# Patient Record
Sex: Female | Born: 2000 | Race: White | Hispanic: No | Marital: Single | Smoking: Never smoker
Health system: Southern US, Community
[De-identification: ages and names within clinical notes are randomized; demographics above are authoritative.]

## PROBLEM LIST (undated history)

## (undated) DIAGNOSIS — F32A Depression, unspecified: Secondary | ICD-10-CM

## (undated) DIAGNOSIS — F419 Anxiety disorder, unspecified: Secondary | ICD-10-CM

## (undated) DIAGNOSIS — G43909 Migraine, unspecified, not intractable, without status migrainosus: Secondary | ICD-10-CM

## (undated) DIAGNOSIS — M1380 Other specified arthritis, unspecified site: Secondary | ICD-10-CM

## (undated) HISTORY — DX: Depression, unspecified: F32.A

## (undated) HISTORY — DX: Other specified arthritis, unspecified site: M13.80

## (undated) HISTORY — DX: Migraine, unspecified, not intractable, without status migrainosus: G43.909

## (undated) HISTORY — DX: Anxiety disorder, unspecified: F41.9

## (undated) HISTORY — PX: HERNIA REPAIR: SHX51

---
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
## 2019-01-10 DIAGNOSIS — N390 Urinary tract infection, site not specified: Secondary | ICD-10-CM | POA: Diagnosis not present

## 2019-01-10 DIAGNOSIS — L259 Unspecified contact dermatitis, unspecified cause: Secondary | ICD-10-CM | POA: Diagnosis not present

## 2019-01-11 DIAGNOSIS — K529 Noninfective gastroenteritis and colitis, unspecified: Secondary | ICD-10-CM | POA: Diagnosis not present

## 2019-01-11 DIAGNOSIS — R1084 Generalized abdominal pain: Secondary | ICD-10-CM | POA: Diagnosis not present

## 2019-01-12 ENCOUNTER — Other Ambulatory Visit: Payer: Self-pay | Admitting: Gastroenterology

## 2019-01-12 ENCOUNTER — Ambulatory Visit
Admission: RE | Admit: 2019-01-12 | Discharge: 2019-01-12 | Disposition: A | Discharge status: Home / Self Care | Payer: BLUE CROSS/BLUE SHIELD | Source: Ambulatory Visit | Attending: Gastroenterology | Admitting: Gastroenterology

## 2019-01-12 DIAGNOSIS — K529 Noninfective gastroenteritis and colitis, unspecified: Secondary | ICD-10-CM | POA: Diagnosis not present

## 2019-01-12 DIAGNOSIS — R1084 Generalized abdominal pain: Secondary | ICD-10-CM | POA: Diagnosis not present

## 2019-01-12 DIAGNOSIS — R109 Unspecified abdominal pain: Secondary | ICD-10-CM | POA: Diagnosis not present

## 2019-02-08 DIAGNOSIS — R110 Nausea: Secondary | ICD-10-CM | POA: Diagnosis not present

## 2019-02-08 DIAGNOSIS — K580 Irritable bowel syndrome with diarrhea: Secondary | ICD-10-CM | POA: Diagnosis not present

## 2019-02-14 DIAGNOSIS — F411 Generalized anxiety disorder: Secondary | ICD-10-CM | POA: Diagnosis not present

## 2019-03-06 DIAGNOSIS — F411 Generalized anxiety disorder: Secondary | ICD-10-CM | POA: Diagnosis not present

## 2019-03-07 DIAGNOSIS — N941 Unspecified dyspareunia: Secondary | ICD-10-CM | POA: Diagnosis not present

## 2019-03-29 DIAGNOSIS — F411 Generalized anxiety disorder: Secondary | ICD-10-CM | POA: Diagnosis not present

## 2019-08-19 IMAGING — CR ABDOMEN - 1 VIEW
1 series · 1 of 1 positions shown · non-contrast
Comparison: None.

CLINICAL DATA: Generalized abdominal pain

EXAM:
ABDOMEN - 1 VIEW

[t abdomen supine]
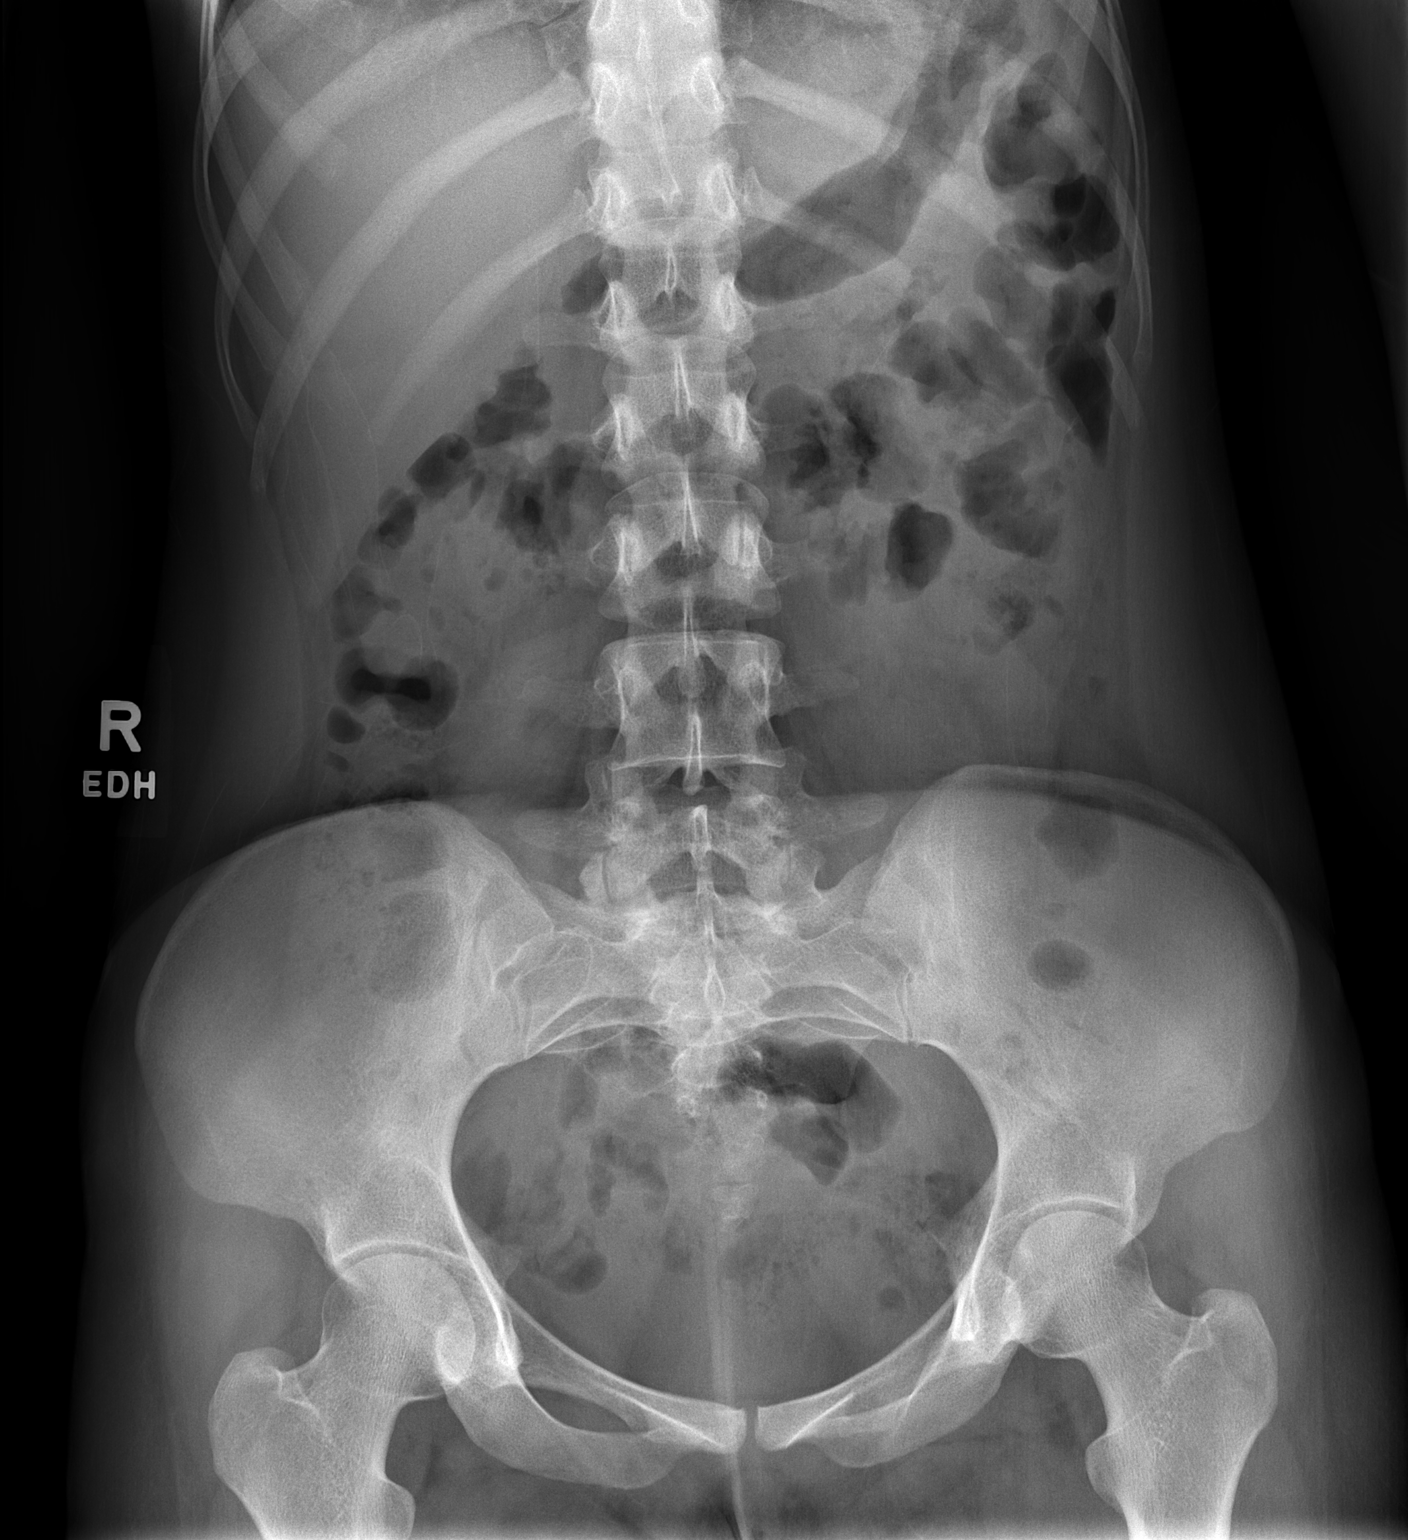

[1 of 1 positions shown; findings below may reference images not displayed]

FINDINGS: The bowel gas pattern is normal. No radio-opaque calculi or other
significant radiographic abnormality are seen.
IMPRESSION: Negative.

## 2020-06-28 DIAGNOSIS — Z20828 Contact with and (suspected) exposure to other viral communicable diseases: Secondary | ICD-10-CM | POA: Diagnosis not present

## 2020-08-23 DIAGNOSIS — F411 Generalized anxiety disorder: Secondary | ICD-10-CM | POA: Diagnosis not present

## 2021-01-17 DIAGNOSIS — Z20822 Contact with and (suspected) exposure to covid-19: Secondary | ICD-10-CM | POA: Diagnosis not present

## 2021-03-15 DIAGNOSIS — H66011 Acute suppurative otitis media with spontaneous rupture of ear drum, right ear: Secondary | ICD-10-CM | POA: Diagnosis not present

## 2021-03-28 DIAGNOSIS — F418 Other specified anxiety disorders: Secondary | ICD-10-CM | POA: Diagnosis not present

## 2021-03-28 DIAGNOSIS — Z6821 Body mass index (BMI) 21.0-21.9, adult: Secondary | ICD-10-CM | POA: Diagnosis not present

## 2021-03-28 DIAGNOSIS — Z01419 Encounter for gynecological examination (general) (routine) without abnormal findings: Secondary | ICD-10-CM | POA: Diagnosis not present

## 2021-03-28 DIAGNOSIS — Z113 Encounter for screening for infections with a predominantly sexual mode of transmission: Secondary | ICD-10-CM | POA: Diagnosis not present

## 2021-03-28 DIAGNOSIS — Z3040 Encounter for surveillance of contraceptives, unspecified: Secondary | ICD-10-CM | POA: Diagnosis not present

## 2021-05-09 DIAGNOSIS — R109 Unspecified abdominal pain: Secondary | ICD-10-CM | POA: Diagnosis not present

## 2021-05-09 DIAGNOSIS — K580 Irritable bowel syndrome with diarrhea: Secondary | ICD-10-CM | POA: Diagnosis not present

## 2021-05-09 DIAGNOSIS — R197 Diarrhea, unspecified: Secondary | ICD-10-CM | POA: Diagnosis not present

## 2021-05-14 DIAGNOSIS — R197 Diarrhea, unspecified: Secondary | ICD-10-CM | POA: Diagnosis not present

## 2021-06-20 DIAGNOSIS — F411 Generalized anxiety disorder: Secondary | ICD-10-CM | POA: Diagnosis not present

## 2021-06-20 DIAGNOSIS — K589 Irritable bowel syndrome without diarrhea: Secondary | ICD-10-CM | POA: Diagnosis not present

## 2021-06-20 DIAGNOSIS — R197 Diarrhea, unspecified: Secondary | ICD-10-CM | POA: Diagnosis not present

## 2021-07-16 DIAGNOSIS — F411 Generalized anxiety disorder: Secondary | ICD-10-CM | POA: Diagnosis not present

## 2021-07-28 DIAGNOSIS — F411 Generalized anxiety disorder: Secondary | ICD-10-CM | POA: Diagnosis not present

## 2021-08-13 DIAGNOSIS — F411 Generalized anxiety disorder: Secondary | ICD-10-CM | POA: Diagnosis not present

## 2021-08-27 DIAGNOSIS — F411 Generalized anxiety disorder: Secondary | ICD-10-CM | POA: Diagnosis not present

## 2021-09-16 DIAGNOSIS — F411 Generalized anxiety disorder: Secondary | ICD-10-CM | POA: Diagnosis not present

## 2021-09-30 DIAGNOSIS — F411 Generalized anxiety disorder: Secondary | ICD-10-CM | POA: Diagnosis not present

## 2021-10-14 DIAGNOSIS — F411 Generalized anxiety disorder: Secondary | ICD-10-CM | POA: Diagnosis not present

## 2021-10-17 DIAGNOSIS — J039 Acute tonsillitis, unspecified: Secondary | ICD-10-CM | POA: Diagnosis not present

## 2021-10-24 DIAGNOSIS — F411 Generalized anxiety disorder: Secondary | ICD-10-CM | POA: Diagnosis not present

## 2021-10-26 DIAGNOSIS — J029 Acute pharyngitis, unspecified: Secondary | ICD-10-CM | POA: Diagnosis not present

## 2021-10-26 DIAGNOSIS — B085 Enteroviral vesicular pharyngitis: Secondary | ICD-10-CM | POA: Diagnosis not present

## 2021-11-11 DIAGNOSIS — F411 Generalized anxiety disorder: Secondary | ICD-10-CM | POA: Diagnosis not present

## 2021-11-25 DIAGNOSIS — F411 Generalized anxiety disorder: Secondary | ICD-10-CM | POA: Diagnosis not present

## 2021-11-26 DIAGNOSIS — J351 Hypertrophy of tonsils: Secondary | ICD-10-CM | POA: Diagnosis not present

## 2021-11-26 DIAGNOSIS — J302 Other seasonal allergic rhinitis: Secondary | ICD-10-CM | POA: Diagnosis not present

## 2021-11-26 DIAGNOSIS — R070 Pain in throat: Secondary | ICD-10-CM | POA: Diagnosis not present

## 2021-12-16 DIAGNOSIS — F411 Generalized anxiety disorder: Secondary | ICD-10-CM | POA: Diagnosis not present

## 2021-12-30 DIAGNOSIS — F411 Generalized anxiety disorder: Secondary | ICD-10-CM | POA: Diagnosis not present

## 2022-01-20 DIAGNOSIS — F411 Generalized anxiety disorder: Secondary | ICD-10-CM | POA: Diagnosis not present

## 2022-02-18 DIAGNOSIS — F411 Generalized anxiety disorder: Secondary | ICD-10-CM | POA: Diagnosis not present

## 2022-03-03 DIAGNOSIS — R52 Pain, unspecified: Secondary | ICD-10-CM | POA: Diagnosis not present

## 2022-03-09 DIAGNOSIS — N760 Acute vaginitis: Secondary | ICD-10-CM | POA: Diagnosis not present

## 2022-03-11 DIAGNOSIS — F411 Generalized anxiety disorder: Secondary | ICD-10-CM | POA: Diagnosis not present

## 2022-03-31 ENCOUNTER — Encounter: Payer: Self-pay | Admitting: Radiology

## 2022-03-31 ENCOUNTER — Ambulatory Visit (INDEPENDENT_AMBULATORY_CARE_PROVIDER_SITE_OTHER): Payer: BC Managed Care – PPO | Admitting: Radiology

## 2022-03-31 VITALS — BP 116/72 | Ht 58.25 in | Wt 105.0 lb

## 2022-03-31 DIAGNOSIS — Z3041 Encounter for surveillance of contraceptive pills: Secondary | ICD-10-CM

## 2022-03-31 DIAGNOSIS — F419 Anxiety disorder, unspecified: Secondary | ICD-10-CM

## 2022-03-31 DIAGNOSIS — F32A Depression, unspecified: Secondary | ICD-10-CM

## 2022-03-31 DIAGNOSIS — Z01419 Encounter for gynecological examination (general) (routine) without abnormal findings: Secondary | ICD-10-CM | POA: Diagnosis not present

## 2022-03-31 MED ORDER — BUPROPION HCL ER (SR) 100 MG PO TB12: 100.0000 mg | ORAL_TABLET | Freq: Every day | ORAL | 4 refills | Status: DC

## 2022-03-31 MED ORDER — NORETHIN ACE-ETH ESTRAD-FE 1.5-30 MG-MCG PO TABS: 1.0000 | ORAL_TABLET | Freq: Every day | ORAL | 4 refills | Status: DC

## 2022-03-31 MED ORDER — SERTRALINE HCL 100 MG PO TABS: 100.0000 mg | ORAL_TABLET | Freq: Every day | ORAL | 4 refills | Status: DC

## 2022-03-31 NOTE — Progress Notes (Signed)
Christina Davidson 2000-12-26 [id number]   History:  21 y.o. G0 presents for annual exam. 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 Moved in with new roommates, doing well. Needs refills on OCPs. Doing well on zoloft and wellbutrin, requests refills. Still sees a therapist regularly.  Gynecologic History Patient's last menstrual period was 02/22/2022 (approximate). Period Cycle (Days): 28 Period Duration (Days): 3 Period Pattern: Regular Menstrual Flow: Moderate Menstrual Control: Tampon Dysmenorrhea: (!) Mild Dysmenorrhea Symptoms: Cramping Contraception/Family planning: abstinence and OCP (estrogen/progesterone) Sexually active: no Last Pap: 03/2021. Results were: normal Last mammogram: n/a.   Obstetric History OB History  Gravida Para Term Preterm AB Living  0 0 0 0 0 0  SAB IAB Ectopic Multiple Live Births  0 0 0 0 0     The following portions of the patient's history were reviewed and updated as appropriate: allergies, current medications, past family history, past medical history, past social history, past surgical history, and problem list.  Review of Systems Pertinent items noted in HPI and remainder of comprehensive ROS otherwise negative.   Past medical history, past surgical history, family history and social history were all reviewed and documented in the EPIC chart.   Exam:  Vitals:   03/31/22 1348  BP: 116/72  Weight: 105 lb (47.6 kg)  Height: 4' 10.25" (1.48 m)   Body mass index is 21.76 kg/m.  General appearance:  Normal Thyroid:  Symmetrical, normal in size, without palpable masses or nodularity. Respiratory  Auscultation:  Clear without wheezing or rhonchi Cardiovascular  Auscultation:  Regular rate, without rubs, murmurs or gallops  Edema/varicosities:  Not grossly evident Abdominal  Soft,nontender, without masses, guarding or rebound.  Liver/spleen:  No organomegaly noted  Hernia:  None appreciated  Skin  Inspection:  Grossly  normal Breasts: Examined lying and sitting.   Right: Without masses, retractions, nipple discharge or axillary adenopathy.   Left: Without masses, retractions, nipple discharge or axillary adenopathy. Genitourinary   Inguinal/mons:  Normal without inguinal adenopathy  External genitalia:  Normal appearing vulva with no masses, tenderness, or lesions  BUS/Urethra/Skene's glands:  Normal without masses or exudate  Vagina:  Normal appearing with normal color and discharge, no lesions  Cervix:  Normal appearing without discharge or lesions  Uterus:  Normal in size, shape and contour.  Mobile, nontender  Adnexa/parametria:     Rt: Normal in size, without masses or tenderness.   Lt: Normal in size, without masses or tenderness.  Anus and perineum: Normal   Patient informed chaperone available to be present for breast and pelvic exam. Patient has requested no chaperone to be present. Patient has been advised what will be completed during breast and pelvic exam.   Assessment/Plan:    1. Well woman exam with routine gynecological exam Pap due 2025 Had STI screening recently, all negative  2. Oral contraceptive pill surveillance - norethindrone-ethinyl estradiol-iron (AUROVELA FE 1.5/30) 1.5-30 MG-MCG tablet; Take 1 tablet by mouth daily.  Dispense: 84 tablet; Refill: 4  3. Anxiety and depression Continue weekly therapy - buPROPion ER (WELLBUTRIN SR) 100 MG 12 hr tablet; Take 1 tablet (100 mg total) by mouth daily.  Dispense: 90 tablet; Refill: 4 - sertraline (ZOLOFT) 100 MG tablet; Take 1 tablet (100 mg total) by mouth at bedtime.  Dispense: 90 tablet; Refill: 4   Discussed SBE, pap and STI screening as directed/appropriate. Recommend of exercise weekly, including weight bearing exercise. Encouraged the use of seatbelts and sunscreen. Return in 1 year for annual or  as needed.   Arlie Solomons B WHNP-BC 2:05 PM 03/31/2022

## 2022-04-08 DIAGNOSIS — F411 Generalized anxiety disorder: Secondary | ICD-10-CM | POA: Diagnosis not present

## 2022-04-20 DIAGNOSIS — G43909 Migraine, unspecified, not intractable, without status migrainosus: Secondary | ICD-10-CM | POA: Diagnosis not present

## 2022-04-20 DIAGNOSIS — R519 Headache, unspecified: Secondary | ICD-10-CM | POA: Diagnosis not present

## 2022-05-06 DIAGNOSIS — F411 Generalized anxiety disorder: Secondary | ICD-10-CM | POA: Diagnosis not present

## 2022-06-11 DIAGNOSIS — L309 Dermatitis, unspecified: Secondary | ICD-10-CM | POA: Diagnosis not present

## 2022-06-11 DIAGNOSIS — L418 Other parapsoriasis: Secondary | ICD-10-CM | POA: Diagnosis not present

## 2022-06-25 DIAGNOSIS — L408 Other psoriasis: Secondary | ICD-10-CM | POA: Diagnosis not present

## 2022-06-25 DIAGNOSIS — L309 Dermatitis, unspecified: Secondary | ICD-10-CM | POA: Diagnosis not present

## 2022-06-29 DIAGNOSIS — F411 Generalized anxiety disorder: Secondary | ICD-10-CM | POA: Diagnosis not present

## 2022-07-27 DIAGNOSIS — F411 Generalized anxiety disorder: Secondary | ICD-10-CM | POA: Diagnosis not present

## 2022-11-10 ENCOUNTER — Encounter: Payer: Self-pay | Admitting: Radiology

## 2022-11-10 ENCOUNTER — Ambulatory Visit (INDEPENDENT_AMBULATORY_CARE_PROVIDER_SITE_OTHER): Payer: BC Managed Care – PPO | Admitting: Radiology

## 2022-11-10 VITALS — BP 104/76

## 2022-11-10 DIAGNOSIS — Z3041 Encounter for surveillance of contraceptive pills: Secondary | ICD-10-CM | POA: Diagnosis not present

## 2022-11-10 DIAGNOSIS — N898 Other specified noninflammatory disorders of vagina: Secondary | ICD-10-CM

## 2022-11-10 DIAGNOSIS — R519 Headache, unspecified: Secondary | ICD-10-CM | POA: Diagnosis not present

## 2022-11-10 DIAGNOSIS — N760 Acute vaginitis: Secondary | ICD-10-CM | POA: Diagnosis not present

## 2022-11-10 DIAGNOSIS — Z113 Encounter for screening for infections with a predominantly sexual mode of transmission: Secondary | ICD-10-CM

## 2022-11-10 LAB — WET PREP FOR TRICH, YEAST, CLUE

## 2022-11-10 MED ORDER — METRONIDAZOLE 500 MG PO TABS: 500.0000 mg | ORAL_TABLET | Freq: Two times a day (BID) | ORAL | 0 refills | Status: DC

## 2022-11-10 MED ORDER — NORETHIN-ETH ESTRAD-FE BIPHAS 1 MG-10 MCG / 10 MCG PO TABS: 1.0000 | ORAL_TABLET | Freq: Every day | ORAL | 3 refills | Status: DC

## 2022-11-10 NOTE — Progress Notes (Signed)
      Subjective: Christina Davidson is a 22 y.o. female who complains of headaches more often, unsure if related to her OCPs. Can not correlate headaches to a certain time of the month of causative agent. Naproxen helps. Had aura 1 time last year. Has not seen her PCP or any other provider about this. C/o vaginal discharge with odor for a few months. Would like STI screening, no new partners.    Review of Systems  All other systems reviewed and are negative.   Past Medical History:  Diagnosis Date   Anxiety    Depression    Migraine       Objective:  Today's Vitals   11/10/22 0937  BP: 104/76   There is no height or weight on file to calculate BMI.   -General: no acute distress -Vulva: without lesions or discharge -Vagina: white thick discharge present, aptima swab and wet prep obtained -Cervix: no lesion or discharge, no CMT -Perineum: no lesions -Uterus: Mobile, non tender -Adnexa: no masses or tenderness   Microscopic wet-mount exam shows clue cells.   Chaperone offered and declined.  Assessment:/Plan:  1. Screen for STD (sexually transmitted disease) - SURESWAB CT/NG/T. vaginalis  2. Vaginal discharge - WET PREP FOR [place], YEAST, CLUE  3. Oral contraceptive pill surveillance Will switch to a 15mcg pill to see if headaches improve - Norethindrone-Ethinyl Estradiol-Fe Biphas (LO LOESTRIN FE) 1 MG-10 MCG / 10 MCG tablet; Take 1 tablet by mouth daily.  Dispense: 84 tablet; Refill: 3  4. BV (bacterial vaginosis) - metroNIDAZOLE (FLAGYL) 500 MG tablet; Take 1 tablet (500 mg total) by mouth 2 (two) times daily.  Dispense: 14 tablet; Refill: 0  pense: 14 tablet; Refill: 0  5. Frequent headaches Follow up with PCP for further workup    Will contact patient with results of testing completed today. Avoid intercourse until symptoms are resolved. Safe sex encouraged. Avoid the use of soaps or perfumed products in the peri area. Avoid tub baths and sitting in sweaty or  wet clothing for prolonged periods of time.

## 2022-11-11 LAB — SURESWAB CT/NG/T. VAGINALIS
C. trachomatis RNA, TMA: NOT DETECTED
N. gonorrhoeae RNA, TMA: NOT DETECTED
Trichomonas vaginalis RNA: NOT DETECTED

## 2023-03-08 DIAGNOSIS — Z8669 Personal history of other diseases of the nervous system and sense organs: Secondary | ICD-10-CM | POA: Diagnosis not present

## 2023-03-08 DIAGNOSIS — K582 Mixed irritable bowel syndrome: Secondary | ICD-10-CM | POA: Diagnosis not present

## 2023-03-08 DIAGNOSIS — F331 Major depressive disorder, recurrent, moderate: Secondary | ICD-10-CM | POA: Diagnosis not present

## 2023-03-08 DIAGNOSIS — F419 Anxiety disorder, unspecified: Secondary | ICD-10-CM | POA: Diagnosis not present

## 2023-04-21 DIAGNOSIS — L538 Other specified erythematous conditions: Secondary | ICD-10-CM | POA: Diagnosis not present

## 2023-04-22 ENCOUNTER — Other Ambulatory Visit: Payer: Self-pay | Admitting: Radiology

## 2023-04-22 DIAGNOSIS — F32A Depression, unspecified: Secondary | ICD-10-CM

## 2023-04-22 NOTE — Telephone Encounter (Signed)
Med refill request: Zoloft Last AEX: 03/31/22, Last OV: 11/10/22 Next AEX: not scheduled Last MMG (if hormonal med) n/a Refill authorized: Please Advise, #90, 0 RF

## 2023-05-13 ENCOUNTER — Other Ambulatory Visit: Payer: Self-pay | Admitting: Radiology

## 2023-05-13 DIAGNOSIS — F419 Anxiety disorder, unspecified: Secondary | ICD-10-CM

## 2023-05-13 NOTE — Telephone Encounter (Signed)
Med refill request:Wellbutrin SR 100 MG Last AEX: 03/31/22 -JC Next AEX: Not Scheduled Last MMG (if hormonal med) N/A  RX pended #30/0RF. Needs updated AEX for refills.    Refill authorized: Please Advise?

## 2023-06-01 DIAGNOSIS — F4312 Post-traumatic stress disorder, chronic: Secondary | ICD-10-CM | POA: Diagnosis not present

## 2023-06-08 DIAGNOSIS — F4312 Post-traumatic stress disorder, chronic: Secondary | ICD-10-CM | POA: Diagnosis not present

## 2023-06-11 DIAGNOSIS — Z8669 Personal history of other diseases of the nervous system and sense organs: Secondary | ICD-10-CM | POA: Diagnosis not present

## 2023-06-11 DIAGNOSIS — K582 Mixed irritable bowel syndrome: Secondary | ICD-10-CM | POA: Diagnosis not present

## 2023-06-11 DIAGNOSIS — L538 Other specified erythematous conditions: Secondary | ICD-10-CM | POA: Diagnosis not present

## 2023-06-24 DIAGNOSIS — K582 Mixed irritable bowel syndrome: Secondary | ICD-10-CM | POA: Diagnosis not present

## 2023-06-24 DIAGNOSIS — Z8669 Personal history of other diseases of the nervous system and sense organs: Secondary | ICD-10-CM | POA: Diagnosis not present

## 2023-06-24 DIAGNOSIS — F4312 Post-traumatic stress disorder, chronic: Secondary | ICD-10-CM | POA: Diagnosis not present

## 2023-06-24 DIAGNOSIS — F325 Major depressive disorder, single episode, in full remission: Secondary | ICD-10-CM | POA: Diagnosis not present

## 2023-06-24 DIAGNOSIS — R768 Other specified abnormal immunological findings in serum: Secondary | ICD-10-CM | POA: Diagnosis not present

## 2023-06-24 DIAGNOSIS — Z23 Encounter for immunization: Secondary | ICD-10-CM | POA: Diagnosis not present

## 2023-07-09 DIAGNOSIS — F4312 Post-traumatic stress disorder, chronic: Secondary | ICD-10-CM | POA: Diagnosis not present

## 2023-07-20 DIAGNOSIS — F4312 Post-traumatic stress disorder, chronic: Secondary | ICD-10-CM | POA: Diagnosis not present

## 2023-07-30 ENCOUNTER — Other Ambulatory Visit: Payer: Self-pay | Admitting: Radiology

## 2023-07-30 DIAGNOSIS — F419 Anxiety disorder, unspecified: Secondary | ICD-10-CM

## 2023-07-30 NOTE — Telephone Encounter (Signed)
Med refill request: sertraline 100mg  Last AEX: 03/31/22 Next AEX: none scheduled Last MMG (if hormonal med) n/a Refill authorized:  sertraline 100mg  #30, needs office visit for further refills. Sent to provider for review.

## 2023-08-26 ENCOUNTER — Encounter: Payer: Self-pay | Admitting: Radiology

## 2023-08-26 ENCOUNTER — Other Ambulatory Visit (HOSPITAL_COMMUNITY)
Admission: RE | Admit: 2023-08-26 | Discharge: 2023-08-26 | Disposition: A | Discharge status: Home / Self Care | Payer: BC Managed Care – PPO | Source: Ambulatory Visit | Attending: Radiology | Admitting: Radiology

## 2023-08-26 ENCOUNTER — Ambulatory Visit (INDEPENDENT_AMBULATORY_CARE_PROVIDER_SITE_OTHER): Payer: BC Managed Care – PPO | Admitting: Radiology

## 2023-08-26 VITALS — BP 116/78 | HR 101 | Ht 58.25 in | Wt 112.0 lb

## 2023-08-26 DIAGNOSIS — N760 Acute vaginitis: Secondary | ICD-10-CM | POA: Diagnosis not present

## 2023-08-26 DIAGNOSIS — N898 Other specified noninflammatory disorders of vagina: Secondary | ICD-10-CM

## 2023-08-26 DIAGNOSIS — Z3041 Encounter for surveillance of contraceptive pills: Secondary | ICD-10-CM

## 2023-08-26 DIAGNOSIS — Z01419 Encounter for gynecological examination (general) (routine) without abnormal findings: Secondary | ICD-10-CM | POA: Diagnosis present

## 2023-08-26 LAB — WET PREP FOR TRICH, YEAST, CLUE

## 2023-08-26 MED ORDER — METRONIDAZOLE 0.75 % VA GEL
1.0000 | Freq: Every day | VAGINAL | 0 refills | Status: AC
Start: 2023-08-26 — End: 2023-08-31

## 2023-08-26 MED ORDER — NORETHIN-ETH ESTRAD-FE BIPHAS 1 MG-10 MCG / 10 MCG PO TABS: 1.0000 | ORAL_TABLET | Freq: Every day | ORAL | 4 refills | Status: DC

## 2023-08-26 NOTE — Patient Instructions (Signed)
 Preventive Care 62-24 Years Old, Female Preventive care refers to lifestyle choices and visits with your health care provider that can promote health and wellness. Preventive care visits are also called wellness exams. What can I expect for my preventive care visit? Counseling During your preventive care visit, your health care provider may ask about your: Medical history, including: Past medical problems. Family medical history. Pregnancy history. Current health, including: Menstrual cycle. Method of birth control. Emotional well-being. Home life and relationship well-being. Sexual activity and sexual health. Lifestyle, including: Alcohol, nicotine or tobacco, and drug use. Access to firearms. Diet, exercise, and sleep habits. Work and work astronomer. Sunscreen use. Safety issues such as seatbelt and bike helmet use. Physical exam Your health care provider may check your: Height and weight. These may be used to calculate your BMI (body mass index). BMI is a measurement that tells if you are at a healthy weight. Waist circumference. This measures the distance around your waistline. This measurement also tells if you are at a healthy weight and may help predict your risk of certain diseases, such as type 2 diabetes and high blood pressure. Heart rate and blood pressure. Body temperature. Skin for abnormal spots. What immunizations do I need?  Vaccines are usually given at various ages, according to a schedule. Your health care provider will recommend vaccines for you based on your age, medical history, and lifestyle or other factors, such as travel or where you work. What tests do I need? Screening Your health care provider may recommend screening tests for certain conditions. This may include: Pelvic exam and Pap test. Lipid and cholesterol levels. Diabetes screening. This is done by checking your blood sugar (glucose) after you have not eaten for a while (fasting). Hepatitis  B test. Hepatitis C test. HIV (human immunodeficiency virus) test. STI (sexually transmitted infection) testing, if you are at risk. BRCA-related cancer screening. This may be done if you have a family history of breast, ovarian, tubal, or peritoneal cancers. Talk with your health care provider about your test results, treatment options, and if necessary, the need for more tests. Follow these instructions at home: Eating and drinking  Eat a healthy diet that includes fresh fruits and vegetables, whole grains, lean protein, and low-fat dairy products. Take vitamin and mineral supplements as recommended by your health care provider. Do not drink alcohol if: Your health care provider tells you not to drink. You are pregnant, may be pregnant, or are planning to become pregnant. If you drink alcohol: Limit how much you have to 0-1 drink a day. Know how much alcohol is in your drink. In the [place] one drink equals one 12 oz bottle of beer (355 mL), one 5 oz glass of wine (148 mL), or one 1 oz glass of hard liquor (44 mL). Lifestyle Brush your teeth every morning and night with fluoride toothpaste. Floss one time each day. Exercise for at least 30 minutes 5 or more days each week. Do not use any products that contain nicotine or tobacco. These products include cigarettes, chewing tobacco, and vaping devices, such as e-cigarettes. If you need help quitting, ask your health care provider. Do not use drugs. If you are sexually active, practice safe sex. Use a condom or other form of protection to prevent STIs. If you do not wish to become pregnant, use a form of birth control. If you plan to become pregnant, see your health care provider for a prepregnancy visit. Find healthy ways to manage stress, such as: Meditation,  yoga, or listening to music. Journaling. Talking to a trusted person. Spending time with friends and family. Minimize exposure to UV radiation to reduce your risk of skin  cancer. Safety Always wear your seat belt while driving or riding in a vehicle. Do not drive: If you have been drinking alcohol. Do not ride with someone who has been drinking. If you have been using any mind-altering substances or drugs. While texting. When you are tired or distracted. Wear a helmet and other protective equipment during sports activities. If you have firearms in your house, make sure you follow all gun safety procedures. Seek help if you have been physically or sexually abused. What's next? Go to your health care provider once a year for an annual wellness visit. Ask your health care provider how often you should have your eyes and teeth checked. Stay up to date on all vaccines. This information is not intended to replace advice given to you by your health care provider. Make sure you discuss any questions you have with your health care provider. Document Revised: 01/29/2021 Document Reviewed: 01/29/2021 Elsevier Patient Education  2024 Else[employer] Inc. Bacterial Vaginosis  Bacterial vaginosis is an infection that occurs when the normal balance of bacteria in the vagina changes. This change is caused by an overgrowth of certain bacteria in the vagina. Bacterial vaginosis is the most common vaginal infection among females aged 64 to 84 years. This condition increases the risk of sexually transmitted infections (STIs). Treatment can help reduce this risk. Treatment is very important for pregnant women because this condition can cause babies to be born early (prematurely) or at a low birth weight. What are the causes? This condition is caused by an increase in harmful bacteria that are normally present in small amounts in the vagina. However, the exact reason this condition develops is not known. You cannot get bacterial vaginosis from toilet seats, bedding, swimming pools, or contact with objects around you. What increases the risk? The following factors may make you more  likely to develop this condition: Having a new sexual partner or multiple sexual partners, or having unprotected sex. Douching. Having an intrauterine device (IUD). Smoking. Abusing drugs and alcohol. This may lead to riskier sexual behavior. Taking certain antibiotic medicines. Being pregnant. What are the signs or symptoms? Some women with this condition have no symptoms. Symptoms may include: [place] or white vaginal discharge. The discharge can be watery or foamy. A fish-like odor with discharge, especially after sex or during menstruation. Itching in and around the vagina. Burning or pain with urination. How is this diagnosed? This condition is diagnosed based on: Your medical history. A physical exam of the vagina. Checking a sample of vaginal fluid for harmful bacteria or abnormal cells. How is this treated? This condition is treated with antibiotic medicines. These may be given as a pill, a vaginal cream, or a medicine that is put into the vagina (suppository). If the condition comes back after treatment, a second round of antibiotics may be needed. Follow these instructions at home: Medicines Take or apply over-the-counter and prescription medicines only as told by your health care provider. Take or apply your antibiotic medicine as told by your health care provider. Do not stop using the antibiotic even if you start to feel better. General instructions If you have a female sexual partner, tell her that you have a vaginal infection. She should follow up with her health care provider. If you have a female sexual partner, he does not need treatment.  Avoid sexual activity until you finish treatment. Drink enough fluid to keep your urine pale yellow. Keep the area around your vagina and rectum clean. Wash the area daily with warm water. Wipe yourself from front to back after using the toilet. If you are breastfeeding, talk to your health care provider about continuing breastfeeding  during treatment. Keep all follow-up visits. This is important. How is this prevented? Self-care Do not douche. Wash the outside of your vagina with warm water only. Wear cotton or cotton-lined underwear. Avoid wearing tight pants and pantyhose, especially during the summer. Safe sex Use protection when having sex. This includes: Using condoms. Using dental dams. This is a thin layer of a material made of latex or polyurethane that protects the mouth during oral sex. Limit the number of sexual partners. To help prevent bacterial vaginosis, it is best to have sex with just one partner (monogamous relationship). Make sure you and your sexual partner are tested for STIs. Drugs and alcohol Do not use any products that contain nicotine or tobacco. These products include cigarettes, chewing tobacco, and vaping devices, such as e-cigarettes. If you need help quitting, ask your health care provider. Do not use drugs. Do not drink alcohol if: Your health care provider tells you not to do this. You are pregnant, may be pregnant, or are planning to become pregnant. If you drink alcohol: Limit how much you have to 0-1 drink a day. Be aware of how much alcohol is in your drink. In the [place] one drink equals one 12 oz bottle of beer (355 mL), one 5 oz glass of wine (148 mL), or one 1 oz glass of hard liquor (44 mL). Where to find more information Centers for Disease Control and Prevention: footballexhibition.com.br American Sexual Health Association (ASHA): www.ashastd.org U.S. Department of Health and Health And Safety Inspector, Office on Women's Health: http://hoffman.com/ Contact a health care provider if: Your symptoms do not improve, even after treatment. You have more discharge or pain when urinating. You have a fever or chills. You have pain in your abdomen or pelvis. You have pain during sex. You have vaginal bleeding between menstrual periods. Summary Bacterial vaginosis is a vaginal infection that occurs when  the normal balance of bacteria in the vagina changes. It results from an overgrowth of certain bacteria. This condition increases the risk of sexually transmitted infections (STIs). Getting treated can help reduce this risk. Treatment is very important for pregnant women because this condition can cause babies to be born early (prematurely) or at low birth weight. This condition is treated with antibiotic medicines. These may be given as a pill, a vaginal cream, or a medicine that is put into the vagina (suppository). This information is not intended to replace advice given to you by your health care provider. Make sure you discuss any questions you have with your health care provider. Document Revised: 02/01/2020 Document Reviewed: 02/01/2020 Elsevier Patient Education  2024 [employer].

## 2023-08-26 NOTE — Progress Notes (Signed)
 Christina Davidson [birth year] [id number]   History:  23 y.o. G0 presents for annual exam. C/o yellow foul smelling d/c. No new partners, has not been sexually active since her last visit 10/2022, had negative STI screen at that time and was treated for BV with flagyl .  Gynecologic History Patient's last menstrual period was 05/18/2023 (approximate). Period Cycle (Days):  (amenorrhea with OCPs) Period Duration (Days): 2 Period Pattern: (!) Irregular Menstrual Flow: Light Menstrual Control: Thin pad, Maxi pad, Tampon Dysmenorrhea: None Contraception/Family planning: abstinence, OCPs Sexually active: no Last Pap: 2022. Results were: normal   Obstetric History OB History  Gravida Para Term Preterm AB Living  0 0 0 0 0 0  SAB IAB Ectopic Multiple Live Births  0 0 0 0 0    The following portions of the patient's history were reviewed and updated as appropriate: allergies, current medications, past family history, past medical history, past social history, past surgical history, and problem list.  Review of Systems  All other systems reviewed and are negative.   Past medical history, past surgical history, family history and social history were all reviewed and documented in the EPIC chart.  Exam:  Vitals:   08/26/23 1425  BP: 116/78  Pulse: (!) 101  SpO2: 96%  Weight: 112 lb (50.8 kg)  Height: 4' 10.25 (1.48 m)   Body mass index is 23.21 kg/m.  Physical Exam Vitals and nursing note reviewed. Exam conducted with a chaperone present.  Constitutional:      Appearance: Normal appearance. She is normal weight.  HENT:     Head: Normocephalic and atraumatic.  Neck:     Thyroid : No thyroid  mass, thyromegaly or thyroid  tenderness.  Cardiovascular:     Rate and Rhythm: Regular rhythm.     Heart sounds: Normal heart sounds.  Pulmonary:     Effort: Pulmonary effort is normal.     Breath sounds: Normal breath sounds.  Chest:  Breasts:    Breasts are symmetrical.     Right:  Normal. No inverted nipple, mass, nipple discharge, skin change or tenderness.     Left: Normal. No inverted nipple, mass, nipple discharge, skin change or tenderness.  Abdominal:     General: Abdomen is flat. Bowel sounds are normal.     Palpations: Abdomen is soft.  Genitourinary:    General: Normal vulva.     Vagina: Vaginal discharge present. No bleeding or lesions.     Cervix: Friability present. No discharge or lesion.     Uterus: Normal. Not enlarged and not tender.      Adnexa: Right adnexa normal and left adnexa normal.       Right: No mass, tenderness or fullness.         Left: No mass, tenderness or fullness.    Lymphadenopathy:     Upper Body:     Right upper body: No axillary adenopathy.     Left upper body: No axillary adenopathy.  Skin:    General: Skin is warm and dry.  Neurological:     Mental Status: She is alert and oriented to person, place, and time.  Psychiatric:        Mood and Affect: Mood normal.        Thought Content: Thought content normal.        Judgment: Judgment normal.     Christina Davidson, CMA present for exam  Microscopic wet-mount exam shows clue cells.    Assessment/Plan:   1. Well woman exam with routine  gynecological exam (Primary) - Cytology - PAP( [place])  2. Vaginal discharge - WET PREP FOR [place]ICH, YEAST, CLUE +BV Rx sent for metrogel   3. Oral contraceptive pill surveillance - Norethindrone-Ethinyl Estradiol-Fe Biphas (LO LOESTRIN FE) 1 MG-10 MCG / 10 MCG tablet; Take 1 tablet by mouth daily.  Dispense: 84 tablet; Refill: 4     Discussed SBE, colonoscopy and DEXA screening as directed/appropriate. Recommend of exercise weekly, including weight bearing exercise. Encouraged the use of seatbelts and sunscreen.  Return in about 1 year (around 08/25/2024) for Annual.  Brice Kossman B WHNP-BC 2:47 PM 08/26/2023

## 2023-08-30 LAB — CYTOLOGY - PAP: Diagnosis: NEGATIVE

## 2023-09-01 ENCOUNTER — Other Ambulatory Visit: Payer: Self-pay | Admitting: Radiology

## 2023-09-01 DIAGNOSIS — F419 Anxiety disorder, unspecified: Secondary | ICD-10-CM

## 2023-09-01 NOTE — Telephone Encounter (Signed)
 Med refill request: sertraline  100mg  Last AEX: 08/26/23 Next AEX: none scheduled Last MMG (if hormonal med) n/a Refill authorized: sertraline  100mg  #30 with 11 refills.  Sent to provider for review.

## 2024-03-01 ENCOUNTER — Telehealth: Payer: Self-pay | Admitting: *Deleted

## 2024-03-01 NOTE — Telephone Encounter (Signed)
 Ok to work into the schedule Monday. Also recommend aquaphor to the tear 2-3 times a day in the meantime

## 2024-03-01 NOTE — Telephone Encounter (Signed)
 Spoke with patient, OV moved to 7/21 at 1045 with JC. Patient aware work in appt. Advised per Jami. Patient verbalizes understanding and is agreeable.   Encounter closed.

## 2024-03-01 NOTE — Telephone Encounter (Signed)
 Spoke with patient. Patient reports a tear in skin after intercourse on 02/25/24. Tear is near the urethra. Bleeding after, since resolved. Reports white to green vaginal discharge with odor and pain. Has been keeping area clean. Was seen at urgent care on 7/13 for UTI symptoms and treated, did not address tear.   Currently scheduled for OV on 03/10/24. Offered OV today or tomorrow, patient declines, is in [place]. Earliest she will be in [place] is Monday. Requesting OV on Monday and recommendations in the meantime.   Advised I will review with Jami and f/u, patient agreeable.   Jami -please advise if patient can be worked into schedule Monday and recommendations.

## 2024-03-01 NOTE — Telephone Encounter (Signed)
 Left message to call [facility] Triage at [phone], option 4.

## 2024-03-06 ENCOUNTER — Ambulatory Visit: Admitting: Radiology

## 2024-03-10 ENCOUNTER — Ambulatory Visit: Admitting: Radiology

## 2024-04-12 ENCOUNTER — Ambulatory Visit (INDEPENDENT_AMBULATORY_CARE_PROVIDER_SITE_OTHER): Admitting: Radiology

## 2024-04-12 ENCOUNTER — Encounter: Payer: Self-pay | Admitting: Radiology

## 2024-04-12 ENCOUNTER — Other Ambulatory Visit: Payer: Self-pay | Admitting: Radiology

## 2024-04-12 VITALS — BP 122/74 | HR 115 | Wt 103.0 lb

## 2024-04-12 DIAGNOSIS — F419 Anxiety disorder, unspecified: Secondary | ICD-10-CM

## 2024-04-12 DIAGNOSIS — K580 Irritable bowel syndrome with diarrhea: Secondary | ICD-10-CM | POA: Diagnosis not present

## 2024-04-12 DIAGNOSIS — L293 Anogenital pruritus, unspecified: Secondary | ICD-10-CM

## 2024-04-12 DIAGNOSIS — F32A Depression, unspecified: Secondary | ICD-10-CM

## 2024-04-12 MED ORDER — BUPROPION HCL ER (XL) 150 MG PO TB24: 150.0000 mg | ORAL_TABLET | Freq: Every morning | ORAL | 0 refills | Status: AC

## 2024-04-12 MED ORDER — CLOBETASOL PROPIONATE 0.05 % EX OINT: 1.0000 | TOPICAL_OINTMENT | Freq: Two times a day (BID) | CUTANEOUS | 0 refills | Status: AC

## 2024-04-12 MED ORDER — SERTRALINE HCL 100 MG PO TABS: 100.0000 mg | ORAL_TABLET | Freq: Every day | ORAL | 0 refills | Status: DC

## 2024-04-12 MED ORDER — DICYCLOMINE HCL 10 MG PO CAPS: 10.0000 mg | ORAL_CAPSULE | Freq: Three times a day (TID) | ORAL | 0 refills | Status: AC

## 2024-04-12 NOTE — Progress Notes (Signed)
      Subjective: Christina Davidson is a 23 y.o. female who complains of rectal itching and irritation, burning with wiping, blood on tissue x's 1, ? bump near anus. Symptoms began last week. Recently moved home, under more stress, has IBS, more diarrhea recently, ran out of meds, needs a new PCP. Requesting refills on her bentyl  and anxiety/depression meds until she sees her new PCP. Feels stable on all and well controlled.   Review of Systems  All other systems reviewed and are negative.   Past Medical History:  Diagnosis Date   Anxiety    Depression    Migraine       Objective:  Today's Vitals   04/12/24 1411  BP: 122/74  Pulse: (!) 115  SpO2: 99%  Weight: 103 lb (46.7 kg)   Body mass index is 21.34 kg/m.   Physical Exam Constitutional:      Appearance: Normal appearance. She is normal weight.  Pulmonary:     Effort: Pulmonary effort is normal.  Abdominal:     General: Abdomen is flat.     Palpations: Abdomen is soft.  Genitourinary:    Rectum: Tenderness (erythema with small fissures on perineum) present.  Neurological:     Mental Status: She is alert.  Psychiatric:        Mood and Affect: Mood normal.        Thought Content: Thought content normal.        Judgment: Judgment normal.    Darice Hoit, CMA present for exam  Assessment:/Plan:   1. Perineal itching, female (Primary) - clobetasol  ointment (TEMOVATE ) 0.05 %; Apply 1 Application topically 2 (two) times daily.  Dispense: 30 g; Refill: 0  2. Anxiety and depression Will refill for 3 month supply only, need to get PCP - buPROPion  (WELLBUTRIN  XL) 150 MG 24 hr tablet; Take 1 tablet (150 mg total) by mouth every morning.  Dispense: 90 tablet; Refill: 0 - sertraline  (ZOLOFT ) 100 MG tablet; Take 1 tablet (100 mg total) by mouth at bedtime.  Dispense: 90 tablet; Refill: 0  3. Irritable bowel syndrome with diarrhea Will refill for 36month supply only, need to get PCP - dicyclomine  (BENTYL ) 10 MG capsule; Take  1 capsule (10 mg total) by mouth 3 (three) times daily.  Dispense: 90 capsule; Refill: 0    Urban Naval B, NP 2:23 PM

## 2024-04-28 ENCOUNTER — Other Ambulatory Visit: Payer: Self-pay | Admitting: Radiology

## 2024-04-28 DIAGNOSIS — Z3041 Encounter for surveillance of contraceptive pills: Secondary | ICD-10-CM

## 2024-04-28 NOTE — Telephone Encounter (Signed)
 Med refill request:LO LOESTRIN FE  Last AEX:  08/26/23 Next AEX: not scheduled  Last MMG (if hormonal med) Refill authorized: Last rx 08/26/23 #84 with 4 refills. Please approve or deny

## 2024-05-10 ENCOUNTER — Other Ambulatory Visit: Payer: Self-pay

## 2024-05-10 DIAGNOSIS — Z3041 Encounter for surveillance of contraceptive pills: Secondary | ICD-10-CM

## 2024-05-10 NOTE — Telephone Encounter (Signed)
 Med refill request:  Norethindrone-Ethinyl Estradiol-Fe Biphas (LO LOESTRIN FE) 1 MG-10 MCG / 10 MCG tablets Start:  08/26/23 - Disp.: #84 with 4 refills  Last AEX:  08/26/23 Next AEX: Not scheduled yet Last MMG (if hormonal med):  N/A Refill authorized? Please Advise.

## 2024-05-11 MED ORDER — NORETHIN-ETH ESTRAD-FE BIPHAS 1 MG-10 MCG / 10 MCG PO TABS: 1.0000 | ORAL_TABLET | Freq: Every day | ORAL | 4 refills | Status: DC

## 2024-08-31 ENCOUNTER — Other Ambulatory Visit: Payer: Self-pay | Admitting: Radiology

## 2024-08-31 DIAGNOSIS — F32A Depression, unspecified: Secondary | ICD-10-CM

## 2024-08-31 NOTE — Telephone Encounter (Signed)
 Med refill request: ZOLOFT   Last AEX: 08/26/23  Next AEX: 09/06/24 Last MMG (if hormonal med) Refill authorized: Last rx 04/12/24 #90 with 0 refills. Please Advise?

## 2024-09-06 ENCOUNTER — Ambulatory Visit (INDEPENDENT_AMBULATORY_CARE_PROVIDER_SITE_OTHER): Payer: Self-pay | Admitting: Radiology

## 2024-09-06 ENCOUNTER — Encounter: Payer: Self-pay | Admitting: Radiology

## 2024-09-06 VITALS — BP 122/70 | HR 108 | Ht 58.25 in | Wt 108.0 lb

## 2024-09-06 DIAGNOSIS — Z01419 Encounter for gynecological examination (general) (routine) without abnormal findings: Secondary | ICD-10-CM | POA: Diagnosis not present

## 2024-09-06 DIAGNOSIS — Z3041 Encounter for surveillance of contraceptive pills: Secondary | ICD-10-CM

## 2024-09-06 DIAGNOSIS — Z1331 Encounter for screening for depression: Secondary | ICD-10-CM

## 2024-09-06 MED ORDER — NORETHIN-ETH ESTRAD-FE BIPHAS 1 MG-10 MCG / 10 MCG PO TABS: 1.0000 | ORAL_TABLET | Freq: Every day | ORAL | 4 refills | Status: AC

## 2024-09-06 NOTE — Progress Notes (Signed)
 "  Christina Davidson 2001-01-27 [id number]   History:  24 y.o. G0 presents for annual exam. Recently diagnosed with inflammatory arthritis, started on Plaquenil last week. No new gyn concerns, going well at [facility]. Happy with current BC, no new sexual partners.   Gynecologic History Patient's last menstrual period was 08/23/2024 (exact date). Period Cycle (Days): 28 Period Duration (Days): 3-4 Period Pattern: Regular Menstrual Flow: Moderate Menstrual Control: Thin pad, Maxi pad, Tampon Dysmenorrhea: (!) Mild Dysmenorrhea Symptoms: Cramping Contraception/Family planning: OCP (estrogen/progesterone) Last Pap: 1/25. Results were: normal   Obstetric History OB History  Gravida Para Term Preterm AB Living  0 0 0 0 0 0  SAB IAB Ectopic Multiple Live Births  0 0 0 0 0       09/06/2024    3:32 PM 08/26/2023    2:27 PM  Depression screen PHQ 2/9  Decreased Interest 0 1  Down, Depressed, Hopeless 0 1  PHQ - 2 Score 0 2  Altered sleeping  1  Tired, decreased energy  1  Change in appetite  0  Feeling bad or failure about yourself   0  Trouble concentrating  0  Moving slowly or fidgety/restless  0  Suicidal thoughts  0  PHQ-9 Score  4   Difficult doing work/chores  Not difficult at all     Data saved with a previous flowsheet row definition     The following portions of the patient's history were reviewed and updated as appropriate: allergies, current medications, past family history, past medical history, past social history, past surgical history, and problem list.  Review of Systems  All other systems reviewed and are negative.   Past medical history, past surgical history, family history and social history were all reviewed and documented in the EPIC chart.  Exam:  Vitals:   09/06/24 1530  BP: 122/70  Pulse: (!) 108  SpO2: 99%  Weight: 108 lb (49 kg)  Height: 4' 10.25 (1.48 m)   Body mass index is 22.38 kg/m.  Physical Exam Vitals and nursing note reviewed. Exam  conducted with a chaperone present.  Constitutional:      Appearance: Normal appearance. She is normal weight.  HENT:     Head: Normocephalic and atraumatic.  Neck:     Thyroid : No thyroid  mass, thyromegaly or thyroid  tenderness.  Cardiovascular:     Rate and Rhythm: Regular rhythm.     Heart sounds: Normal heart sounds.  Pulmonary:     Effort: Pulmonary effort is normal.     Breath sounds: Normal breath sounds.  Chest:  Breasts:    Breasts are symmetrical.     Right: Normal. No inverted nipple, mass, nipple discharge, skin change or tenderness.     Left: Normal. No inverted nipple, mass, nipple discharge, skin change or tenderness.  Abdominal:     General: Abdomen is flat. Bowel sounds are normal.     Palpations: Abdomen is soft.  Genitourinary:    General: Normal vulva.     Vagina: Normal. No vaginal discharge, bleeding or lesions.     Cervix: Normal. No discharge or lesion.     Uterus: Normal. Not enlarged and not tender.      Adnexa: Right adnexa normal and left adnexa normal.       Right: No mass, tenderness or fullness.         Left: No mass, tenderness or fullness.    Lymphadenopathy:     Upper Body:     Right upper body: No  axillary adenopathy.     Left upper body: No axillary adenopathy.  Skin:    General: Skin is warm and dry.  Neurological:     Mental Status: She is alert and oriented to person, place, and time.  Psychiatric:        Mood and Affect: Mood normal.        Thought Content: Thought content normal.        Judgment: Judgment normal.      Christina Davidson, CMA present for exam  Assessment/Plan:   1. Well woman exam with routine gynecological exam (Primary) Pap 2028  2. Depression screen Managed by outside provider  3. Oral contraceptive pill surveillance - Norethindrone-Ethinyl Estradiol-Fe Biphas (LO LOESTRIN FE) 1 MG-10 MCG / 10 MCG tablet; Take 1 tablet by mouth daily.  Dispense: 84 tablet; Refill: 4   Return in about 1 year (around 09/06/2025)  for Annual.  Christina Davidson B WHNP-BC 3:59 PM 09/06/2024 "

## 2024-09-06 NOTE — Patient Instructions (Signed)
